# Patient Record
Sex: Female | Born: 1965 | Hispanic: Yes | Marital: Single | Smoking: Never smoker
Health system: Southern US, Community
[De-identification: ages and names within clinical notes are randomized; demographics above are authoritative.]

## PROBLEM LIST (undated history)

## (undated) DIAGNOSIS — E119 Type 2 diabetes mellitus without complications: Secondary | ICD-10-CM

## (undated) DIAGNOSIS — E785 Hyperlipidemia, unspecified: Secondary | ICD-10-CM

## (undated) DIAGNOSIS — I10 Essential (primary) hypertension: Secondary | ICD-10-CM

---

## 2007-11-08 ENCOUNTER — Emergency Department: Payer: Self-pay | Admitting: Emergency Medicine

## 2007-11-08 ENCOUNTER — Other Ambulatory Visit: Payer: Self-pay

## 2014-03-06 ENCOUNTER — Emergency Department: Payer: Self-pay | Admitting: Emergency Medicine

## 2014-05-13 ENCOUNTER — Emergency Department: Payer: Self-pay | Admitting: Emergency Medicine

## 2014-11-27 ENCOUNTER — Other Ambulatory Visit: Payer: Self-pay | Admitting: Oncology

## 2014-11-27 DIAGNOSIS — Z139 Encounter for screening, unspecified: Secondary | ICD-10-CM

## 2014-12-14 ENCOUNTER — Ambulatory Visit: Payer: Self-pay | Attending: Oncology

## 2014-12-14 ENCOUNTER — Ambulatory Visit: Payer: Self-pay

## 2015-01-25 ENCOUNTER — Ambulatory Visit: Payer: Self-pay

## 2016-06-03 ENCOUNTER — Emergency Department
Admission: EM | Admit: 2016-06-03 | Discharge: 2016-06-03 | Disposition: A | Discharge status: Home / Self Care | Payer: No Typology Code available for payment source | Attending: Emergency Medicine | Admitting: Emergency Medicine

## 2016-06-03 ENCOUNTER — Emergency Department: Payer: No Typology Code available for payment source

## 2016-06-03 DIAGNOSIS — I10 Essential (primary) hypertension: Secondary | ICD-10-CM | POA: Diagnosis not present

## 2016-06-03 DIAGNOSIS — M791 Myalgia: Secondary | ICD-10-CM | POA: Diagnosis present

## 2016-06-03 DIAGNOSIS — Y92410 Unspecified street and highway as the place of occurrence of the external cause: Secondary | ICD-10-CM | POA: Insufficient documentation

## 2016-06-03 DIAGNOSIS — Y9389 Activity, other specified: Secondary | ICD-10-CM | POA: Insufficient documentation

## 2016-06-03 DIAGNOSIS — M25512 Pain in left shoulder: Secondary | ICD-10-CM

## 2016-06-03 DIAGNOSIS — M7918 Myalgia, other site: Secondary | ICD-10-CM

## 2016-06-03 DIAGNOSIS — Y999 Unspecified external cause status: Secondary | ICD-10-CM | POA: Insufficient documentation

## 2016-06-03 DIAGNOSIS — E119 Type 2 diabetes mellitus without complications: Secondary | ICD-10-CM | POA: Diagnosis not present

## 2016-06-03 DIAGNOSIS — S39012A Strain of muscle, fascia and tendon of lower back, initial encounter: Secondary | ICD-10-CM

## 2016-06-03 HISTORY — DX: Hyperlipidemia, unspecified: E78.5

## 2016-06-03 HISTORY — DX: Essential (primary) hypertension: I10

## 2016-06-03 HISTORY — DX: Type 2 diabetes mellitus without complications: E11.9

## 2016-06-03 MED ORDER — CYCLOBENZAPRINE HCL 5 MG PO TABS: 5.0000 mg | ORAL_TABLET | Freq: Three times a day (TID) | ORAL | 0 refills | Status: AC | PRN

## 2016-06-03 MED ORDER — NAPROXEN 500 MG PO TABS
500.0000 mg | ORAL_TABLET | Freq: Once | ORAL | Status: AC
  Administered 2016-06-03: 500 mg via ORAL
  Filled 2016-06-03: qty 1

## 2016-06-03 MED ORDER — NAPROXEN 500 MG PO TBEC: 500.0000 mg | DELAYED_RELEASE_TABLET | Freq: Two times a day (BID) | ORAL | 0 refills | Status: AC

## 2016-06-03 MED ORDER — CYCLOBENZAPRINE HCL 10 MG PO TABS
10.0000 mg | ORAL_TABLET | Freq: Once | ORAL | Status: AC
  Administered 2016-06-03: 10 mg via ORAL
  Filled 2016-06-03: qty 1
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
## 2016-06-03 NOTE — ED Notes (Signed)
Pt ambulatory to and from x-ray with steady gait noted 
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
## 2016-06-03 NOTE — Discharge Instructions (Signed)
Your exam and x-rays are normal. You will continue to have some muscle tenderness and stiffness for a few days. Apply ice packs to any sore muscles. Take the prescription meds as directed. [facility].  ###################################################### Su examen y rayos x son normales. Usted seguira con adolorimiento de musculos y tieso por unos dias.  Aplique paquetes de hielo en los musculos adoloridos.  Tome medicinas como se indica y dese un seguimiento en [facility] si los problemas continuan.

## 2020-02-25 ENCOUNTER — Ambulatory Visit: Payer: Self-pay | Attending: Oncology

## 2020-09-17 ENCOUNTER — Encounter: Payer: Self-pay | Admitting: Physician Assistant

## 2020-10-20 ENCOUNTER — Other Ambulatory Visit: Payer: Self-pay

## 2020-10-20 ENCOUNTER — Ambulatory Visit: Payer: Self-pay | Attending: Oncology | Admitting: *Deleted

## 2020-10-20 VITALS — BP 157/92 | HR 85 | Temp 97.8°F | Ht 62.21 in | Wt 225.9 lb

## 2020-10-20 DIAGNOSIS — N63 Unspecified lump in unspecified breast: Secondary | ICD-10-CM

## 2020-10-20 NOTE — Progress Notes (Signed)
  Subjective:     Patient ID: Vanessa Adkins, female   DOB: 1965/09/06, 55 y.o.   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[facility]) [employer]    Initial Mammogram 10/20/20    Last Mammogram Never    Recent Breast Symptoms None      Breast Cancer History   Breast Cancer History No personal or family history      Previous History of Breast Problems   Breast Surgery or Biopsy None    Breast Implants N/A    BSE Done Never      Gynecological/Obstetrical History   LMP --   2 years   Age at menarche 59    Age at menopause 76    Date of last PAP  02/23/20    Provider (PAP) [place]    Age at first live birth 26    Breast fed children Yes (type length in comments)   2 years   DES Exposure Unkown    Cervical, Uterine or Ovarian cancer No    Family history of Cervial, Uterine or Ovarian cancer No    Hysterectomy No    Cervix removed No    Ovaries removed No    Laser/Cryosurgery No    Current method of birth control None    Current method of Estrogen/Hormone replacement None    Smoking history None             Review of Systems     Objective:   Physical Exam Chest:  Breasts:     Right: No swelling, bleeding, inverted nipple, mass, nipple discharge, skin change, tenderness, axillary adenopathy or supraclavicular adenopathy.     Left: Mass present. No swelling, bleeding, inverted nipple, nipple discharge, skin change, tenderness, axillary adenopathy or supraclavicular adenopathy.     Lymphadenopathy:     Upper Body:     Right upper body: No supraclavicular, axillary or pectoral adenopathy.     Left upper body: No supraclavicular, axillary or pectoral adenopathy.        Assessment:     55 year old Hispanic female referred to [facility] for clinical breast and baseline mammogram.   Vanessa Adkins, [id number] from [employer] was used for interpretation.  Patient's daughter was also present during the exam.  On  clinical breast exam I can palpate an approximate 1 cm firm nodule at 1:00 at the edge of the areola, and a 0.5 cm adjacent nodule at 1:30 at the edge of the areola of the left breast.  Patient states "it's been there for a while".   States it's been less than a year, but for months.  Taught self breast awareness.  Last pap on 02/10/20 was HPV - ASCUS.  Patient will be due for next pap in 3 years.  Patient has been screened for eligibility.  She does not have any insurance, Medicare or Medicaid.  She also meets financial eligibility.   Risk Assessment    Risk Scores      10/20/2020   Last edited by: Scarlett Presto, RN   5-year risk: 0.5 %   Lifetime risk: 4 %             Plan:     Will get bilateral diagnostic mammogram and ultrasound.  Will get Joellyn Quails to schedule patient's appointment.  Will follow up per [facility] protocol.

## 2020-10-26 ENCOUNTER — Ambulatory Visit
Admission: RE | Admit: 2020-10-26 | Discharge: 2020-10-26 | Disposition: A | Discharge status: Home / Self Care | Payer: Self-pay | Source: Ambulatory Visit | Attending: Oncology | Admitting: Oncology

## 2020-10-26 ENCOUNTER — Other Ambulatory Visit: Payer: Self-pay

## 2020-10-26 DIAGNOSIS — N63 Unspecified lump in unspecified breast: Secondary | ICD-10-CM

## 2020-10-29 ENCOUNTER — Encounter: Payer: Self-pay | Admitting: *Deleted

## 2020-10-29 NOTE — Progress Notes (Signed)
Letter mailed from the [facility] to inform patient of her normal mammogram results.  Patient is to follow-up with annual screening in one year.  Patient with benign findings on imaging.

## 2021-12-11 IMAGING — MG DIGITAL DIAGNOSTIC BILAT W/ TOMO W/ CAD
8 series · 8 of 24 positions shown · non-contrast
Comparison: None.

CLINICAL DATA: 1 cm mass felt on recent physical examination in the
1 o'clock position of the left breast at the areolar edge and a
cm mass felt on recent physical examination at the 1:30 o'clock
areolar edge. The patient does not feel either mass today.

EXAM:
DIGITAL DIAGNOSTIC BILATERAL MAMMOGRAM WITH TOMOSYNTHESIS AND CAD;
ULTRASOUND LEFT BREAST LIMITED
TECHNIQUE: Bilateral digital diagnostic mammography and breast tomosynthesis
was performed. The images were evaluated with computer-aided
detection.; Targeted ultrasound examination of the left breast was
performed

[L CC synth-2D]
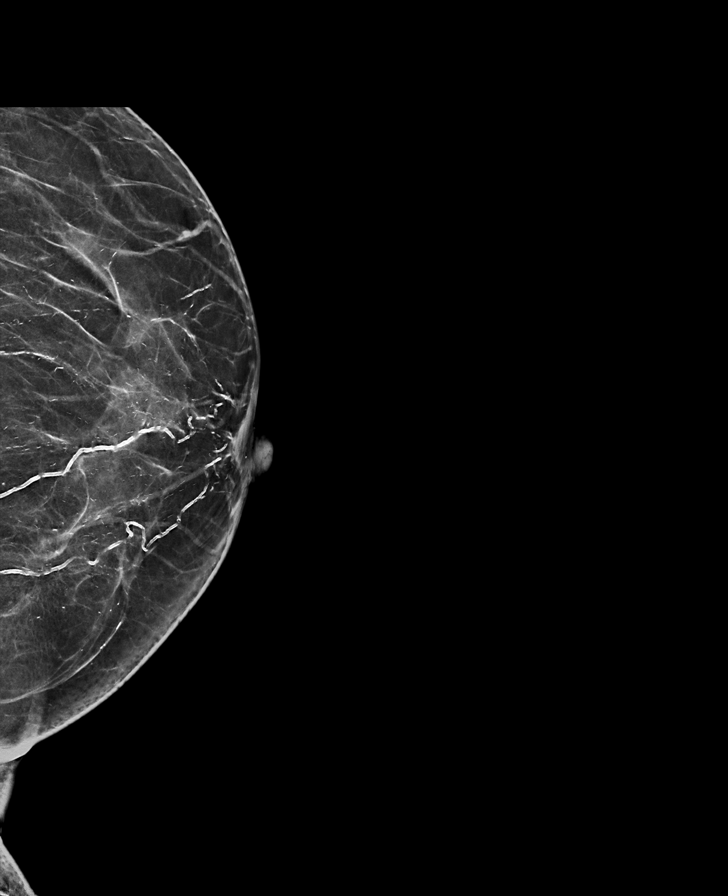

[R MLO synth-2D]
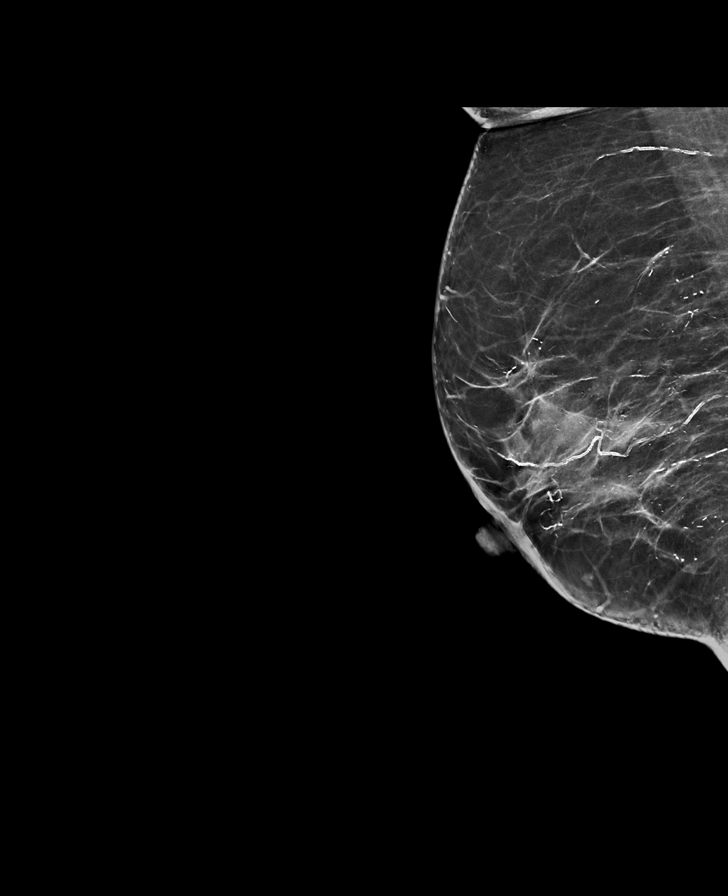

[R CC synth-2D]
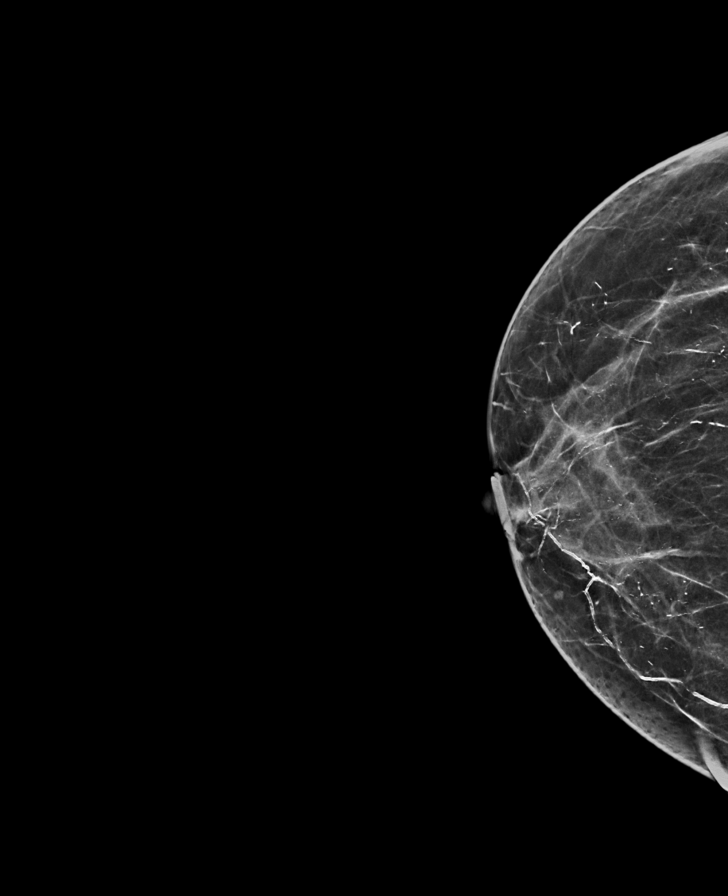

[L MLO synth-2D]
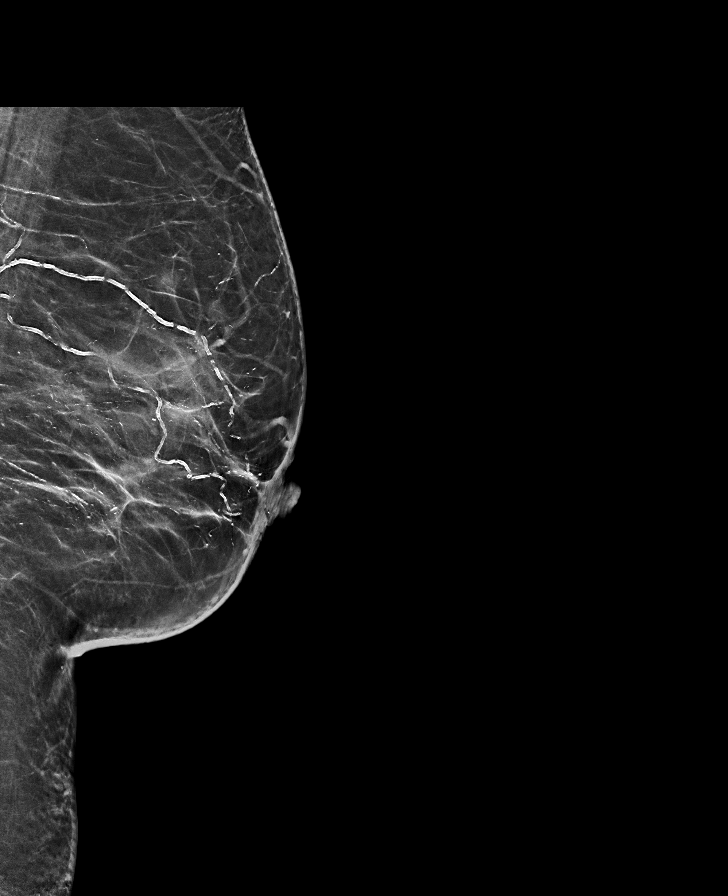

[L CC tomo · tomo slice 32/63.0]
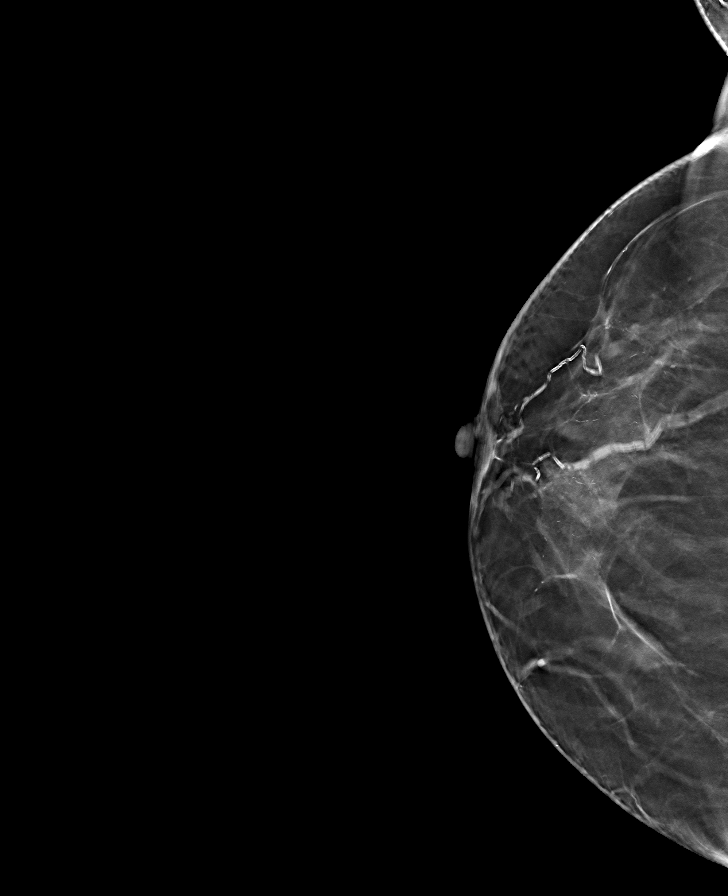

[R CC tomo · tomo slice 29/58.0]
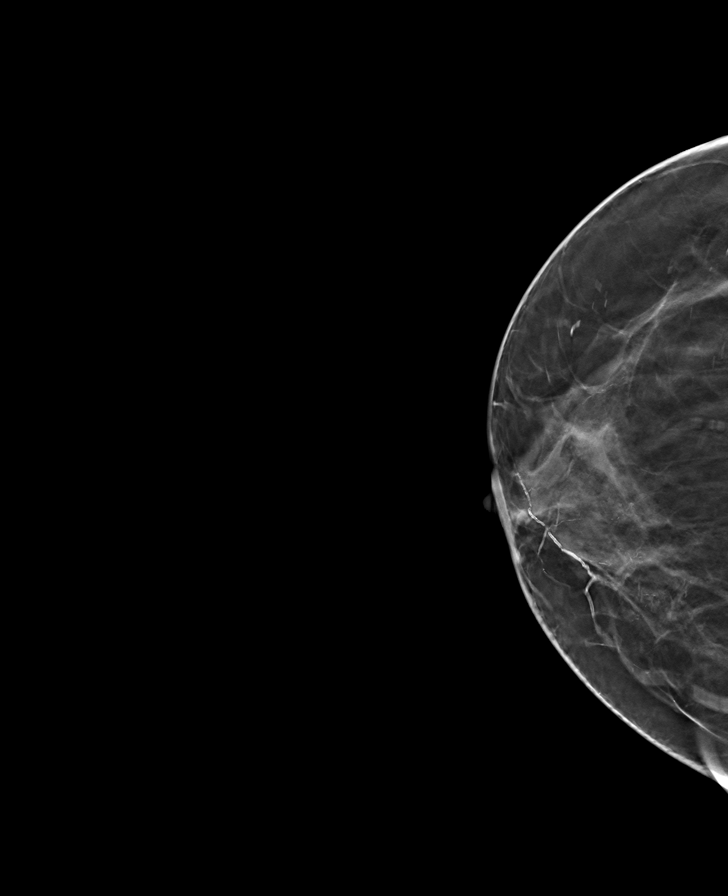

[L MLO tomo · tomo slice 33/66.0]
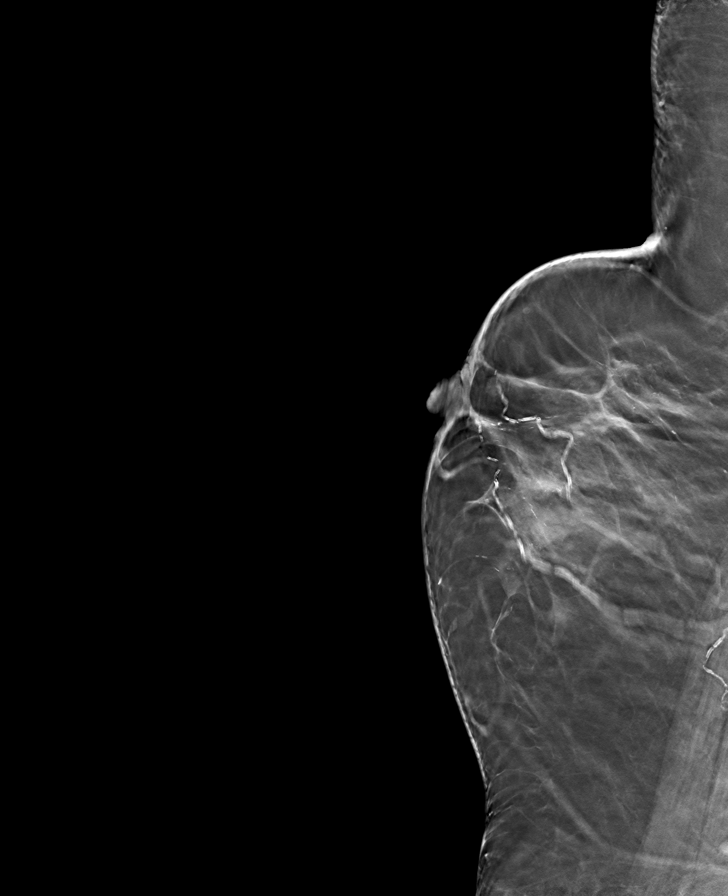

[R MLO tomo · tomo slice 33/64.0]
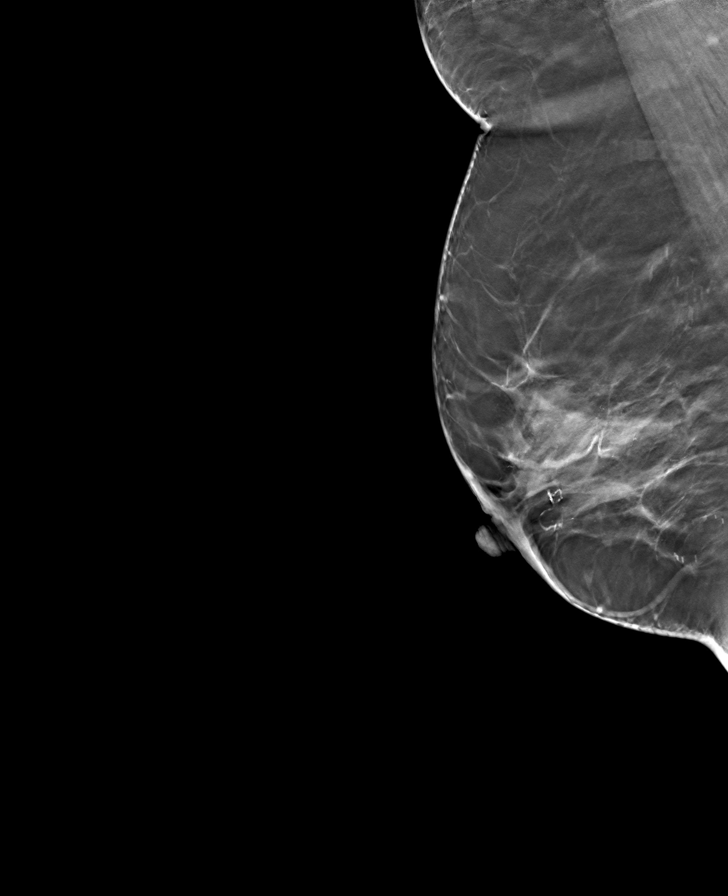

[8 of 24 positions shown; findings below may reference images not displayed]

Baseline.

ACR Breast Density Category c: The breast tissue is heterogeneously
dense, which may obscure small masses.
FINDINGS: Extensive bilateral vascular calcifications. No mass or other
findings suspicious for malignancy in either breast.

On physical exam, there is an approximately 1 cm oval, palpable,
mass-like area in the 1 o'clock position of the left breast, 2 cm
from the nipple and a similar area in the 1:30 o'clock position of
the left breast, 2 cm from the nipple. These are somewhat
compressible.

Targeted ultrasound is performed, showing a normal appearing
subcutaneous fat lobule corresponding to the palpable mass-like area
in 1 o'clock position of the left breast and a normal appearing
subcutaneous fat lobule corresponding to the palpable mass-like area
in the 1:30 o'clock position of the left breast. No solid or cystic
masses were seen.
IMPRESSION: No evidence of malignancy. The recently suspected left breast masses
are normal subcutaneous fat lobules.

RECOMMENDATION:
Bilateral screening mammogram in 1 year.

I have discussed the findings and recommendations with the patient.
If applicable, a reminder letter will be sent to the patient
regarding the next appointment.

BI-RADS CATEGORY  1: Negative.
